# Patient Record
Sex: Male | Born: 1955 | Race: White | Hispanic: No | Smoking: Former smoker
Health system: Southern US, Community
[De-identification: ages and names within clinical notes are randomized; demographics above are authoritative.]

## PROBLEM LIST (undated history)

## (undated) DIAGNOSIS — C4490 Unspecified malignant neoplasm of skin, unspecified: Secondary | ICD-10-CM

## (undated) DIAGNOSIS — I10 Essential (primary) hypertension: Secondary | ICD-10-CM

---

## 2006-11-04 ENCOUNTER — Emergency Department (HOSPITAL_COMMUNITY): Admission: EM | Admit: 2006-11-04 | Discharge: 2006-11-05 | Payer: Self-pay | Admitting: Emergency Medicine

## 2010-12-18 LAB — BASIC METABOLIC PANEL
BUN: 34 — ABNORMAL HIGH
CO2: 20
Chloride: 105
Creatinine, Ser: 1.78 — ABNORMAL HIGH
Glucose, Bld: 92

## 2010-12-18 LAB — CBC
HCT: 40.3
MCHC: 35.2
MCV: 92.2
Platelets: 222

## 2010-12-18 LAB — POCT CARDIAC MARKERS: Operator id: 272551

## 2019-02-09 ENCOUNTER — Other Ambulatory Visit: Payer: Self-pay

## 2019-02-09 ENCOUNTER — Observation Stay (HOSPITAL_BASED_OUTPATIENT_CLINIC_OR_DEPARTMENT_OTHER)
Admission: EM | Admit: 2019-02-09 | Discharge: 2019-02-10 | Disposition: A | Discharge status: Home / Self Care | Payer: Managed Care, Other (non HMO) | Attending: Internal Medicine | Admitting: Internal Medicine

## 2019-02-09 ENCOUNTER — Emergency Department (HOSPITAL_BASED_OUTPATIENT_CLINIC_OR_DEPARTMENT_OTHER): Payer: Managed Care, Other (non HMO)

## 2019-02-09 ENCOUNTER — Encounter (HOSPITAL_BASED_OUTPATIENT_CLINIC_OR_DEPARTMENT_OTHER): Payer: Self-pay

## 2019-02-09 DIAGNOSIS — U071 COVID-19: Secondary | ICD-10-CM | POA: Diagnosis not present

## 2019-02-09 DIAGNOSIS — I10 Essential (primary) hypertension: Secondary | ICD-10-CM

## 2019-02-09 DIAGNOSIS — G459 Transient cerebral ischemic attack, unspecified: Principal | ICD-10-CM | POA: Diagnosis present

## 2019-02-09 DIAGNOSIS — E876 Hypokalemia: Secondary | ICD-10-CM | POA: Diagnosis not present

## 2019-02-09 DIAGNOSIS — E785 Hyperlipidemia, unspecified: Secondary | ICD-10-CM | POA: Diagnosis not present

## 2019-02-09 DIAGNOSIS — Z87891 Personal history of nicotine dependence: Secondary | ICD-10-CM | POA: Diagnosis not present

## 2019-02-09 DIAGNOSIS — Z7982 Long term (current) use of aspirin: Secondary | ICD-10-CM | POA: Diagnosis not present

## 2019-02-09 DIAGNOSIS — Z79899 Other long term (current) drug therapy: Secondary | ICD-10-CM | POA: Diagnosis not present

## 2019-02-09 DIAGNOSIS — R4789 Other speech disturbances: Secondary | ICD-10-CM | POA: Diagnosis present

## 2019-02-09 HISTORY — DX: Unspecified malignant neoplasm of skin, unspecified: C44.90

## 2019-02-09 HISTORY — DX: Essential (primary) hypertension: I10

## 2019-02-09 LAB — COMPREHENSIVE METABOLIC PANEL
ALT: 17 U/L (ref 0–44)
AST: 29 U/L (ref 15–41)
Albumin: 4.1 g/dL (ref 3.5–5.0)
Alkaline Phosphatase: 70 U/L (ref 38–126)
Anion gap: 11 (ref 5–15)
BUN: 18 mg/dL (ref 8–23)
CO2: 22 mmol/L (ref 22–32)
Calcium: 9.2 mg/dL (ref 8.9–10.3)
Chloride: 103 mmol/L (ref 98–111)
Creatinine, Ser: 1.06 mg/dL (ref 0.61–1.24)
GFR calc Af Amer: >60 mL/min (ref >60)
GFR calc non Af Amer: >60 mL/min (ref >60)
Glucose, Bld: 110 mg/dL — ABNORMAL HIGH (ref 70–99)
Potassium: 3.4 mmol/L — ABNORMAL LOW (ref 3.5–5.1)
Sodium: 136 mmol/L (ref 135–145)
Total Bilirubin: 0.9 mg/dL (ref 0.3–1.2)
Total Protein: 7.9 g/dL (ref 6.5–8.1)

## 2019-02-09 LAB — DIFFERENTIAL
Abs Immature Granulocytes: 0.04 10*3/uL (ref 0.00–0.07)
Basophils Absolute: 0 10*3/uL (ref 0.0–0.1)
Basophils Relative: 0 %
Eosinophils Absolute: 0 10*3/uL (ref 0.0–0.5)
Eosinophils Relative: 0 %
Immature Granulocytes: 1 %
Lymphocytes Relative: 21 %
Lymphs Abs: 1 10*3/uL (ref 0.7–4.0)
Monocytes Absolute: 0.4 10*3/uL (ref 0.1–1.0)
Monocytes Relative: 9 %
Neutro Abs: 3.2 10*3/uL (ref 1.7–7.7)
Neutrophils Relative %: 69 %

## 2019-02-09 LAB — CBC
HCT: 48.6 % (ref 39.0–52.0)
Hemoglobin: 16.9 g/dL (ref 13.0–17.0)
MCH: 31.6 pg (ref 26.0–34.0)
MCHC: 34.8 g/dL (ref 30.0–36.0)
MCV: 90.8 fL (ref 80.0–100.0)
Platelets: 161 10*3/uL (ref 150–400)
RBC: 5.35 MIL/uL (ref 4.22–5.81)
RDW: 12.1 % (ref 11.5–15.5)
WBC: 4.7 10*3/uL (ref 4.0–10.5)
nRBC: 0 % (ref 0.0–0.2)

## 2019-02-09 LAB — URINALYSIS, ROUTINE W REFLEX MICROSCOPIC
Bilirubin Urine: NEGATIVE
Glucose, UA: NEGATIVE mg/dL
Hgb urine dipstick: NEGATIVE
Ketones, ur: 15 mg/dL — AB
Leukocytes,Ua: NEGATIVE
Nitrite: NEGATIVE
Protein, ur: NEGATIVE mg/dL
Specific Gravity, Urine: 1.015 (ref 1.005–1.030)
pH: 6 (ref 5.0–8.0)

## 2019-02-09 LAB — ETHANOL: Alcohol, Ethyl (B): <10 mg/dL (ref <10)

## 2019-02-09 LAB — RAPID URINE DRUG SCREEN, HOSP PERFORMED
Amphetamines: NOT DETECTED
Barbiturates: NOT DETECTED
Benzodiazepines: NOT DETECTED
Cocaine: NOT DETECTED
Opiates: NOT DETECTED
Tetrahydrocannabinol: NOT DETECTED

## 2019-02-09 LAB — APTT: aPTT: 28 seconds (ref 24–36)

## 2019-02-09 LAB — PROTIME-INR
INR: 0.9 (ref 0.8–1.2)
Prothrombin Time: 12.4 seconds (ref 11.4–15.2)

## 2019-02-09 MED ORDER — ASPIRIN EC 325 MG PO TBEC
325.0000 mg | DELAYED_RELEASE_TABLET | Freq: Once | ORAL | Status: AC
  Administered 2019-02-09: 325 mg via ORAL
  Filled 2019-02-09: qty 1

## 2019-02-09 NOTE — ED Notes (Signed)
ED Provider at bedside. 

## 2019-02-09 NOTE — ED Notes (Signed)
  Patient ambulated to restroom.  Patient tolerated well and required no assistance.

## 2019-02-09 NOTE — Progress Notes (Signed)
Patient with h/o HTN (not on medications, but recently noted to have marked return and so resumed therapy a couple of days ago) presenting to [facility] with aphasia lasting about 20-25 minutes.  Concern for TIA, teleneurology has seen.  CT with ?old event.  Recommend observation for further evaluation.    Carlyon Shadow,, M.D.

## 2019-02-09 NOTE — Consult Note (Signed)
[facility] Consult Services  Stat Consult  Date of Service:   02/09/2019 13:47:26  Impression:     .  G45.9 - Transient cerebral ischaemic attack, unspecified  Comments/Sign-Out: Patient presenting with TIA associated with HTN. NIHSS:0. CTH without acute findings. Will recommend admission for stroke work up. Will need MRI brain, MRA or CTA head and neck. Echo, lipid panel, TSH, A1c, B12. PT consult. IVF's: 0.9 NSS at 120 ml/hr. Treat if SBP >200 and SBP >110.  CT HEAD: Showed No Acute Hemorrhage or Acute Core Infarct  Metrics: [facility] Notification Time: 02/09/2019 13:45:47 Stamp Time: 02/09/2019 13:47:26 Callback Response Time: 02/09/2019 13:48:12 Video Start Time: 02/09/2019 14:17:03 Video End Time: 02/09/2019 14:31:34  Our recommendations are outlined below.  Recommendations:     .  Antiplatelet Therapy   Imaging Studies:     .  MRI Head     .  MRA Head and Neck Without Contrast When Available - Stroke Protocol     .  Echocardiogram - Transthoracic Echocardiogram  Therapies:     .  Physical Therapy, Occupational Therapy, Speech Therapy Assessment When Applicable  Other WorkUp:     .  Check B12 level     .  Check TSH  Disposition: Neurology Follow Up Recommended  Sign Out:     .  Discussed with Emergency Department Provider  ----------------------------------------------------------------------------------------------------  Chief Complaint: TIA  History of Present Illness: Patient is a 63 year old Male.  Patient reported he was at home when suddenly started feeling that he could not get he words out. He said he knew what he wanted to say but it was not coming out. He said all episode lasted 50 minutes. He said he had similar episode years ago but at that time he was smoking and he was overweight. He is not smoking anymore and have been taking care of himself but most recently started having HTN and was started on  medications. He said at home is now getting better. Today on arrival it was elevated.           Examination: BP(146/100), Pulse(96), Blood Glucose(110) 1A: Level of Consciousness - Alert; keenly responsive + 0 1B: Ask Month and Age - Both Questions Right + 0 1C: Blink Eyes & Squeeze Hands - Performs Both Tasks + 0 2: Test Horizontal Extraocular Movements - Normal + 0 3: Test Visual Fields - No Visual Loss + 0 4: Test Facial Palsy (Use Grimace if Obtunded) - Normal symmetry + 0 5A: Test Left Arm Motor Drift - No Drift for 10 Seconds + 0 5B: Test Right Arm Motor Drift - No Drift for 10 Seconds + 0 6A: Test Left Leg Motor Drift - No Drift for 5 Seconds + 0 6B: Test Right Leg Motor Drift - No Drift for 5 Seconds + 0 7: Test Limb Ataxia (FNF/Heel-Shin) - No Ataxia + 0 8: Test Sensation - Normal; No sensory loss + 0 9: Test Language/Aphasia - Normal; No aphasia + 0 10: Test Dysarthria - Normal + 0 11: Test Extinction/Inattention - No abnormality + 0  NIHSS Score: 0     Dr Anabel Halon   [facility] [phone]  Case [id number]

## 2019-02-09 NOTE — ED Provider Notes (Signed)
[facility] EMERGENCY DEPARTMENT Provider Note   CSN: [id number] Arrival date & time: 02/09/19  1248     History   Chief Complaint Chief Complaint  Patient presents with   Speech Problem    HPI Dan Gibson is a 63 y.o. male.  He has a history of hypertension but has been off medications for 8 years.  He had a skin cancer removed last week and his blood pressure was noted to be very elevated.  2 days ago he saw his PCP and was put on a medication for elevated blood pressure.  Today while at lunch with his wife at 3 he began talking in [place] per his wife.  The symptoms lasted about 20 to 25 minutes.  It was not associated with any double vision numbness weakness or any difficulty with gait.  He said maybe his vision was a little blurry but he also thinks he needs glasses.  He feels that his symptoms are completely back to normal.  Low-grade headache that he blames on sinus problems.  He said he had one prior episode of this speech difficulty years ago and never got it worked up.     The history is provided by the patient.  Cerebrovascular Accident This is a new problem. The current episode started less than 1 hour ago. The problem has been resolved. Associated symptoms include headaches. Pertinent negatives include no chest pain, no abdominal pain and no shortness of breath. Nothing aggravates the symptoms. Nothing relieves the symptoms. He has tried nothing for the symptoms. The treatment provided significant relief.    Past Medical History:  Diagnosis Date   Hypertension    Skin cancer     There are no active problems to display for this patient.   Past Surgical History:  Procedure Laterality Date   BACK SURGERY     VASECTOMY          Home Medications    Prior to Admission medications   Not on File    Family History No family history on file.  Social History Social History   Tobacco Use   Smoking status: Former Smoker   Smokeless  tobacco: Never Used  Substance Use Topics   Alcohol use: Yes    Comment: daily-2 beers   Drug use: Never     Allergies   Patient has no known allergies.   Review of Systems Review of Systems  Constitutional: Negative for fever.  HENT: Negative for sore throat.   Eyes: Negative for visual disturbance.  Respiratory: Negative for shortness of breath.   Cardiovascular: Negative for chest pain.  Gastrointestinal: Negative for abdominal pain.  Genitourinary: Negative for dysuria.  Musculoskeletal: Negative for neck pain.  Skin: Negative for rash.  Neurological: Positive for speech difficulty and headaches. Negative for dizziness, seizures, syncope, facial asymmetry, weakness, light-headedness and numbness.     Physical Exam Updated Vital Signs BP (!) 144/91 (BP Location: Left Arm)    Pulse 96    Temp 99.3 F (37.4 C) (Oral)    Resp 18    Ht 5\' 7"  (1.702 m)    Wt 73.9 kg    SpO2 100%    BMI 25.53 kg/m   Physical Exam Vitals signs and nursing note reviewed.  Constitutional:      Appearance: He is well-developed.  HENT:     Head: Normocephalic and atraumatic.  Eyes:     Conjunctiva/sclera: Conjunctivae normal.  Neck:     Musculoskeletal: Neck supple.  Cardiovascular:  Rate and Rhythm: Normal rate and regular rhythm.     Heart sounds: No murmur.  Pulmonary:     Effort: Pulmonary effort is normal. No respiratory distress.     Breath sounds: Normal breath sounds.  Abdominal:     Palpations: Abdomen is soft.     Tenderness: There is no abdominal tenderness.  Musculoskeletal: Normal range of motion.     Right lower leg: No edema.     Left lower leg: No edema.  Skin:    General: Skin is warm and dry.     Capillary Refill: Capillary refill takes less than 2 seconds.  Neurological:     General: No focal deficit present.     Mental Status: He is alert.     GCS: GCS eye subscore is 4. GCS verbal subscore is 5. GCS motor subscore is 6.     Cranial Nerves: Cranial nerves  are intact.     Sensory: Sensation is intact.     Motor: Motor function is intact.     Coordination: Coordination is intact.     Gait: Gait is intact.      ED Treatments / Results  Labs (all labs ordered are listed, but only abnormal results are displayed) Labs Reviewed  COMPREHENSIVE METABOLIC PANEL - Abnormal; Notable for the following components:      Result Value   Potassium 3.4 (*)    Glucose, Bld 110 (*)    All other components within normal limits  URINALYSIS, ROUTINE W REFLEX MICROSCOPIC - Abnormal; Notable for the following components:   Ketones, ur 15 (*)    All other components within normal limits  SARS CORONAVIRUS 2 (TAT 6-24 HRS)  ETHANOL  PROTIME-INR  APTT  CBC  DIFFERENTIAL  RAPID URINE DRUG SCREEN, HOSP PERFORMED    EKG EKG Interpretation  Date/Time:  Friday February 09 2019 13:33:39 EST Ventricular Rate:  90 PR Interval:    QRS Duration: 93 QT Interval:  371 QTC Calculation: 454 R Axis:   73 Text Interpretation: Sinus rhythm Consider right atrial enlargement Borderline ST elevation, anterior leads similar to prior 8/08 Confirmed by Aletta Edouard [phone]) on 02/09/2019 1:40:02 PM   Radiology Ct Head Wo Contrast  Result Date: 02/09/2019 CLINICAL DATA:  Speech difficulty EXAM: CT HEAD WITHOUT CONTRAST TECHNIQUE: Contiguous axial images were obtained from the base of the skull through the vertex without intravenous contrast. COMPARISON:  None. FINDINGS: Brain: There is no acute intracranial hemorrhage, mass-effect, or edema. Gray-white differentiation is preserved. There is no extra-axial fluid collection. There are age-indeterminate small vessel infarcts of the right basal ganglia and adjacent white matter as well as the right subinsular white matter. Ventricles and sulci are within normal limits in size and configuration. Vascular: No hyperdense vessel or unexpected calcification. Skull: Calvarium is unremarkable. Sinuses/Orbits: Minor paranasal sinus  mucosal thickening. Orbits are unremarkable. Other: Mastoid air cells are clear. IMPRESSION: No acute intracranial hemorrhage or mass effect. Age-indeterminate small vessel infarcts of the right basal ganglia and right subinsular white matter. Electronically Signed   By: Macy Mis M.D.   On: 02/09/2019 14:26    Procedures Procedures (including critical care time)  Medications Ordered in ED Medications  aspirin EC tablet 325 mg (325 mg Oral Given 02/09/19 1503)     Initial Impression / Assessment and Plan / ED Course  I have reviewed the triage vital signs and the nursing notes.  Pertinent labs & imaging results that were available during my care of the patient were reviewed  by me and considered in my medical decision making (see chart for details).  Clinical Course as of Feb 08 1805  Fri Feb 08, 7142  [id number] 63 year old male with recently diagnosed of elevated blood pressure and started new medication 2 days ago here with 20 minutes of aphasia and garbled speech.  Symptoms completely resolved.  Mildly hypertensive here.  Differential includes hypertensive urgency/emergency, TIA, stroke, bleed, hypoglycemia.   [MB]  [id number] Discussed with teleneurologist Dr. Enid Derry.  He is thinking this is a TIA and the patient should be admitted for further work-up.  Head CT is showing some old signs of stroke so patient possibly had a prior event.   [MB]  [id number] I reviewed the results with the patient and he is agreeable to admission.  [facility] hospitalist has been paged.   [MB]  [id number] Discussed with Dr. Lorin Mercy from [facility] hospitalist accepts patient for admission.   [MB]    Clinical Course User Index [MB] Hayden Rasmussen, MD        Final Clinical Impressions(s) / ED Diagnoses   Final diagnoses:  TIA (transient ischemic attack)  Essential hypertension    ED Discharge Orders    None       Hayden Rasmussen, MD 02/09/19 [phone]

## 2019-02-09 NOTE — ED Triage Notes (Addendum)
Per wife pt had episode just PTA while at lunch where pt "was talking gibberish and like he couldn't get his words out"-lasted ~20-25 mins-pt agrees and states he feels baseline-wife agrees-wife states pt started new BP 2 days ago-pt noncompliant and decreased dose at home-pt NAD-active/alert-NAD-steady gait

## 2019-02-10 ENCOUNTER — Observation Stay (HOSPITAL_COMMUNITY): Payer: Managed Care, Other (non HMO)

## 2019-02-10 ENCOUNTER — Encounter (HOSPITAL_COMMUNITY): Payer: Self-pay | Admitting: Internal Medicine

## 2019-02-10 ENCOUNTER — Observation Stay (HOSPITAL_BASED_OUTPATIENT_CLINIC_OR_DEPARTMENT_OTHER): Payer: Managed Care, Other (non HMO)

## 2019-02-10 DIAGNOSIS — I340 Nonrheumatic mitral (valve) insufficiency: Secondary | ICD-10-CM

## 2019-02-10 DIAGNOSIS — G459 Transient cerebral ischemic attack, unspecified: Secondary | ICD-10-CM | POA: Diagnosis not present

## 2019-02-10 DIAGNOSIS — E876 Hypokalemia: Secondary | ICD-10-CM

## 2019-02-10 DIAGNOSIS — I10 Essential (primary) hypertension: Secondary | ICD-10-CM

## 2019-02-10 DIAGNOSIS — U071 COVID-19: Secondary | ICD-10-CM

## 2019-02-10 LAB — HEMOGLOBIN A1C
Hgb A1c MFr Bld: 5.6 % (ref 4.8–5.6)
Mean Plasma Glucose: 114.02 mg/dL

## 2019-02-10 LAB — LACTATE DEHYDROGENASE: LDH: 175 U/L (ref 98–192)

## 2019-02-10 LAB — LIPID PANEL
Cholesterol: 149 mg/dL (ref 0–200)
HDL: 45 mg/dL (ref >40)
LDL Cholesterol: 85 mg/dL (ref 0–99)
Total CHOL/HDL Ratio: 3.3 RATIO
Triglycerides: 94 mg/dL (ref <150)
VLDL: 19 mg/dL (ref 0–40)

## 2019-02-10 LAB — MAGNESIUM: Magnesium: 2 mg/dL (ref 1.7–2.4)

## 2019-02-10 LAB — HIV ANTIBODY (ROUTINE TESTING W REFLEX): HIV Screen 4th Generation wRfx: NONREACTIVE

## 2019-02-10 LAB — FERRITIN: Ferritin: 513 ng/mL — ABNORMAL HIGH (ref 24–336)

## 2019-02-10 LAB — FIBRINOGEN: Fibrinogen: 538 mg/dL — ABNORMAL HIGH (ref 210–475)

## 2019-02-10 LAB — SARS CORONAVIRUS 2 (TAT 6-24 HRS): SARS Coronavirus 2: POSITIVE — AB

## 2019-02-10 LAB — VITAMIN B12: Vitamin B-12: 292 pg/mL (ref 180–914)

## 2019-02-10 LAB — C-REACTIVE PROTEIN: CRP: 3.4 mg/dL — ABNORMAL HIGH (ref <1.0)

## 2019-02-10 LAB — ECHOCARDIOGRAM COMPLETE
Height: 67 in
Weight: 2608 oz

## 2019-02-10 LAB — D-DIMER, QUANTITATIVE: D-Dimer, Quant: 0.51 ug/mL-FEU — ABNORMAL HIGH (ref 0.00–0.50)

## 2019-02-10 LAB — TSH: TSH: 2.016 u[IU]/mL (ref 0.350–4.500)

## 2019-02-10 MED ORDER — ASPIRIN 325 MG PO TABS: 325.0000 mg | ORAL_TABLET | Freq: Every day | ORAL | 0 refills | Status: AC

## 2019-02-10 MED ORDER — HYDROCHLOROTHIAZIDE 12.5 MG PO CAPS
12.5000 mg | ORAL_CAPSULE | Freq: Every day | ORAL | Status: DC
  Administered 2019-02-10: 12.5 mg via ORAL
  Filled 2019-02-10: qty 1

## 2019-02-10 MED ORDER — VITAMIN C 500 MG PO TABS
500.0000 mg | ORAL_TABLET | Freq: Every day | ORAL | Status: DC
  Administered 2019-02-10: 500 mg via ORAL
  Filled 2019-02-10: qty 1

## 2019-02-10 MED ORDER — ASPIRIN 325 MG PO TABS
325.0000 mg | ORAL_TABLET | Freq: Every day | ORAL | Status: DC
  Administered 2019-02-10: 325 mg via ORAL
  Filled 2019-02-10: qty 1

## 2019-02-10 MED ORDER — LOSARTAN POTASSIUM 50 MG PO TABS
50.0000 mg | ORAL_TABLET | Freq: Every day | ORAL | Status: DC
  Administered 2019-02-10: 50 mg via ORAL
  Filled 2019-02-10: qty 1

## 2019-02-10 MED ORDER — ATORVASTATIN CALCIUM 80 MG PO TABS: 80.0000 mg | ORAL_TABLET | Freq: Every day | ORAL | 2 refills | Status: AC

## 2019-02-10 MED ORDER — ZINC SULFATE 220 (50 ZN) MG PO CAPS
220.0000 mg | ORAL_CAPSULE | Freq: Every day | ORAL | Status: DC
  Administered 2019-02-10: 220 mg via ORAL
  Filled 2019-02-10 (×2): qty 1

## 2019-02-10 MED ORDER — STROKE: EARLY STAGES OF RECOVERY BOOK
Freq: Once | Status: AC
  Administered 2019-02-10: 05:00:00
  Filled 2019-02-10: qty 1

## 2019-02-10 MED ORDER — ACETAMINOPHEN 650 MG RE SUPP: 650.0000 mg | RECTAL | Status: DC | PRN

## 2019-02-10 MED ORDER — POTASSIUM CHLORIDE CRYS ER 20 MEQ PO TBCR
40.0000 meq | EXTENDED_RELEASE_TABLET | Freq: Once | ORAL | Status: AC
  Administered 2019-02-10: 40 meq via ORAL
  Filled 2019-02-10: qty 2

## 2019-02-10 MED ORDER — LOSARTAN POTASSIUM-HCTZ 50-12.5 MG PO TABS: 1.0000 | ORAL_TABLET | Freq: Every day | ORAL | Status: DC

## 2019-02-10 MED ORDER — CHLORHEXIDINE GLUCONATE CLOTH 2 % EX PADS: 6.0000 | MEDICATED_PAD | Freq: Every day | CUTANEOUS | Status: DC

## 2019-02-10 MED ORDER — ATORVASTATIN CALCIUM 40 MG PO TABS: 80.0000 mg | ORAL_TABLET | Freq: Every day | ORAL | Status: DC

## 2019-02-10 MED ORDER — ACETAMINOPHEN 160 MG/5ML PO SOLN: 650.0000 mg | ORAL | Status: DC | PRN

## 2019-02-10 MED ORDER — ACETAMINOPHEN 325 MG PO TABS
650.0000 mg | ORAL_TABLET | ORAL | Status: DC | PRN
  Administered 2019-02-10: 650 mg via ORAL
  Filled 2019-02-10: qty 2

## 2019-02-10 MED ORDER — ENOXAPARIN SODIUM 40 MG/0.4ML ~~LOC~~ SOLN
40.0000 mg | SUBCUTANEOUS | Status: DC
  Administered 2019-02-10: 40 mg via SUBCUTANEOUS
  Filled 2019-02-10: qty 0.4

## 2019-02-10 NOTE — Discharge Summary (Signed)
Physician Discharge Summary  Dan Gibson [id number] DOB: May 29, 1955 DOA: 02/09/2019  PCP: Patient, No Pcp Per  Admit date: 02/09/2019 Discharge date: 02/10/2019  Admitted From: Home. Disposition: Home.  Recommendations for Outpatient Follow-up:  1. Follow up with PCP in 1-2 weeks Follow-up with neurology, referral sent Home Health: Not applicable Equipment/Devices: Not applicable  Discharge Condition: Stable CODE STATUS: Full code Diet recommendation: Low-salt diet  Discharge summary: Patient with history of hypertension, recently started on treatment with lisinopril hydrochlorothiazide had sudden onset of garbled speech for about 20 minutes and back to normal came to emergency room with these abnormal sudden onset of garbled speech.  In the emergency room, blood pressure is stable.  No neurological findings.  Head CT showing some chronic ischemic changes.  Seen by neurology.  Admitted to the hospital as TIA work-up.  Also found to have COVID-19 infection.  TIA: Clinical findings, sudden onset garbled speech no other neuro deficits.  No neuro deficits on arrival to ER. CT head findings, age-indeterminate small vessel infarcts of the right basal ganglia and right subinsular white matter. 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  Chronic small vessel ischemic changes.  No acute infarctions. MRA of the head and neck: Normal.  No large vessel occlusions. 2D echocardiogram, normal ejection fraction.  No PFO.  No evidence of blood clot. Treatment plan: No neuro deficit.  Neurology recommended aspirin 325 mg daily, LDL was 85, Lipitor 80 mg daily, A1c was 5.6, no indication for treatment.  Resume blood pressure medications and compliance was educated.    COVID-19 infection: No pulmonary symptoms.  No cough or fever.  Chest x-ray showed possible bilateral infiltrates.  Patient is on room air and walking around with no pulmonary  symptoms.  Currently no indication for treat. COVID-19 home monitoring, isolation and quarantine precautions instructions given. Can use Tylenol for fever.  Over-the-counter cough medications. Patient will visit ER or call his primary care physician if he has any symptoms of shortness of breath, increasing cough or very high fever or fatigue.    Discharge Diagnoses:  Principal Problem:   TIA (transient ischemic attack) Active Problems:   HTN (hypertension)   Lab test positive for detection of COVID-19 virus   Hypokalemia    Discharge Instructions  Discharge Instructions    Ambulatory referral to Neurology   Complete by: As directed    An appointment is requested in approximately: 4 weeks   Call MD for:  difficulty breathing, headache or visual disturbances   Complete by: As directed    Call MD for:  temperature >100.4   Complete by: As directed    Diet - low sodium heart healthy   Complete by: As directed    Increase activity slowly   Complete by: As directed    MyChart COVID-19 home monitoring program   Complete by: Feb 10, 2019    Is the patient willing to use the [facility] for home monitoring?: Yes   Temperature monitoring   Complete by: Feb 10, 2019    After how many days would you like to receive a notification of this patient's flowsheet entries?: 1     Allergies as of 02/10/2019   No Known Allergies     Medication List    TAKE these medications   aspirin 325 MG tablet Take 1 tablet (325 mg total) by mouth daily. Start taking on: February 11, 2019   atorvastatin 80 MG tablet Commonly known as:  LIPITOR Take 1 tablet (80 mg total) by mouth daily at 6 PM.   colchicine 0.6 MG tablet Take 0.6 mg by mouth 2 (two) times daily as needed. Gout   fluorouracil 5 % cream Commonly known as: EFUDEX Apply 1 application topically 2 (two) times daily. For 30 days. Apply and leave on for 6 hours then wash off.   losartan-hydrochlorothiazide 50-12.5 MG  tablet Commonly known as: HYZAAR Take 1 tablet by mouth daily.       No Known Allergies  Consultations:  Neurology   Procedures/Studies: Ct Head Wo Contrast  Result Date: 02/09/2019 CLINICAL DATA:  Speech difficulty EXAM: CT HEAD WITHOUT CONTRAST TECHNIQUE: Contiguous axial images were obtained from the base of the skull through the vertex without intravenous contrast. COMPARISON:  None. FINDINGS: Brain: There is no acute intracranial hemorrhage, mass-effect, or edema. Gray-white differentiation is preserved. There is no extra-axial fluid collection. There are age-indeterminate small vessel infarcts of the right basal ganglia and adjacent white matter as well as the right subinsular white matter. Ventricles and sulci are within normal limits in size and configuration. Vascular: No hyperdense vessel or unexpected calcification. Skull: Calvarium is unremarkable. Sinuses/Orbits: Minor paranasal sinus mucosal thickening. Orbits are unremarkable. Other: Mastoid air cells are clear. IMPRESSION: No acute intracranial hemorrhage or mass effect. Age-indeterminate small vessel infarcts of the right basal ganglia and right subinsular white matter. Electronically Signed   By: Macy Mis M.D.   On: 02/09/2019 14:26   Mr Angio Head Wo Contrast  Result Date: 02/10/2019 CLINICAL DATA:  Hypertension and speech disturbance. EXAM: MRI HEAD WITHOUT CONTRAST MRA HEAD WITHOUT CONTRAST MRA NECK WITHOUT CONTRAST TECHNIQUE: Multiplanar, multiecho pulse sequences of the brain and surrounding structures were obtained without intravenous contrast. Angiographic images of the Circle of Willis were obtained using MRA technique without intravenous contrast. Angiographic images of the neck were obtained using MRA technique without intravenous contrast. Carotid stenosis measurements (when applicable) are obtained utilizing NASCET criteria, using the distal internal carotid diameter as the denominator. COMPARISON:  Head CT  yesterday. FINDINGS: MRI HEAD FINDINGS Brain: Diffusion imaging does not show any acute or subacute infarction. The brainstem and cerebellum do not show any focal lesion. 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 Cerebral hemispheres show old small vessel infarctions in the basal ganglia on the right and moderate chronic small-vessel ischemic changes of the deep and subcortical white matter. No large vessel territory infarction. Superficial siderosis also affecting the surface of the supratentorial brain, though much less pronounced. No hydrocephalus. No extra-axial collection. Vascular: Major vessels at the base of the brain show flow. Skull and upper cervical spine: Negative Sinuses/Orbits: Clear/normal Other: None MRA HEAD FINDINGS Both internal carotid arteries are widely patent into the brain. No siphon stenosis. The anterior and middle cerebral vessels are patent without proximal stenosis, aneurysm or vascular malformation. Both vertebral arteries are widely patent to the basilar. No basilar stenosis. Posterior circulation branch vessels appear normal. MRA NECK FINDINGS Aortic arch appears normal. Branching pattern is normal without origin stenosis. Both common carotid arteries are widely patent to the bifurcation region. Both carotid bifurcations appear normal. Cervical internal carotid arteries appear normal. Both vertebral arteries appear widely patent at their origin and through the cervical region to the basilar. 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 Is there a history of previous CNS hemorrhagic event? Consider MRI of the thoracic spine with and without contrast as thoracic dural AV fistula can be a cause of this, particularly in  males of this age. 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 Chronic small-vessel ischemic changes elsewhere of the cerebral hemispheric white matter. Negative noncontrast MR angiography  of the neck vessels and intracranial vessels. Electronically Signed   By: Nelson Chimes M.D.   On: 02/10/2019 13:54   Mr Angio Neck Wo Contrast  Result Date: 02/10/2019 CLINICAL DATA:  Hypertension and speech disturbance. EXAM: MRI HEAD WITHOUT CONTRAST MRA HEAD WITHOUT CONTRAST MRA NECK WITHOUT CONTRAST TECHNIQUE: Multiplanar, multiecho pulse sequences of the brain and surrounding structures were obtained without intravenous contrast. Angiographic images of the Circle of Willis were obtained using MRA technique without intravenous contrast. Angiographic images of the neck were obtained using MRA technique without intravenous contrast. Carotid stenosis measurements (when applicable) are obtained utilizing NASCET criteria, using the distal internal carotid diameter as the denominator. COMPARISON:  Head CT yesterday. FINDINGS: MRI HEAD FINDINGS Brain: Diffusion imaging does not show any acute or subacute infarction. The brainstem and cerebellum do not show any focal lesion. 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 Cerebral hemispheres show old small vessel infarctions in the basal ganglia on the right and moderate chronic small-vessel ischemic changes of the deep and subcortical white matter. No large vessel territory infarction. Superficial siderosis also affecting the surface of the supratentorial brain, though much less pronounced. No hydrocephalus. No extra-axial collection. Vascular: Major vessels at the base of the brain show flow. Skull and upper cervical spine: Negative Sinuses/Orbits: Clear/normal Other: None MRA HEAD FINDINGS Both internal carotid arteries are widely patent into the brain. No siphon stenosis. The anterior and middle cerebral vessels are patent without proximal stenosis, aneurysm or vascular malformation. Both vertebral arteries are widely patent to the basilar. No basilar stenosis. Posterior circulation branch vessels appear normal. MRA NECK FINDINGS Aortic arch appears normal.  Branching pattern is normal without origin stenosis. Both common carotid arteries are widely patent to the bifurcation region. Both carotid bifurcations appear normal. Cervical internal carotid arteries appear normal. Both vertebral arteries appear widely patent at their origin and through the cervical region to the basilar. 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 Is there a history of previous CNS hemorrhagic event? Consider MRI of the thoracic spine with and without contrast as thoracic dural AV fistula can be a cause of this, particularly in males of this age. 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 Chronic small-vessel ischemic changes elsewhere of the cerebral hemispheric white matter. Negative noncontrast MR angiography of the neck vessels and intracranial vessels. Electronically Signed   By: Nelson Chimes M.D.   On: 02/10/2019 13:54   Mr Brain Wo Contrast  Result Date: 02/10/2019 CLINICAL DATA:  Hypertension and speech disturbance. EXAM: MRI HEAD WITHOUT CONTRAST MRA HEAD WITHOUT CONTRAST MRA NECK WITHOUT CONTRAST TECHNIQUE: Multiplanar, multiecho pulse sequences of the brain and surrounding structures were obtained without intravenous contrast. Angiographic images of the Circle of Willis were obtained using MRA technique without intravenous contrast. Angiographic images of the neck were obtained using MRA technique without intravenous contrast. Carotid stenosis measurements (when applicable) are obtained utilizing NASCET criteria, using the distal internal carotid diameter as the denominator. COMPARISON:  Head CT yesterday. FINDINGS: MRI HEAD FINDINGS Brain: Diffusion imaging does not show any acute or subacute infarction. The brainstem and cerebellum do not show any focal lesion. 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 Cerebral hemispheres show old small vessel infarctions in the basal ganglia on the right and  moderate chronic small-vessel ischemic changes of the  deep and subcortical white matter. No large vessel territory infarction. Superficial siderosis also affecting the surface of the supratentorial brain, though much less pronounced. No hydrocephalus. No extra-axial collection. Vascular: Major vessels at the base of the brain show flow. Skull and upper cervical spine: Negative Sinuses/Orbits: Clear/normal Other: None MRA HEAD FINDINGS Both internal carotid arteries are widely patent into the brain. No siphon stenosis. The anterior and middle cerebral vessels are patent without proximal stenosis, aneurysm or vascular malformation. Both vertebral arteries are widely patent to the basilar. No basilar stenosis. Posterior circulation branch vessels appear normal. MRA NECK FINDINGS Aortic arch appears normal. Branching pattern is normal without origin stenosis. Both common carotid arteries are widely patent to the bifurcation region. Both carotid bifurcations appear normal. Cervical internal carotid arteries appear normal. Both vertebral arteries appear widely patent at their origin and through the cervical region to the basilar. 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 Is there a history of previous CNS hemorrhagic event? Consider MRI of the thoracic spine with and without contrast as thoracic dural AV fistula can be a cause of this, particularly in males of this age. 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 Chronic small-vessel ischemic changes elsewhere of the cerebral hemispheric white matter. Negative noncontrast MR angiography of the neck vessels and intracranial vessels. Electronically Signed   By: Nelson Chimes M.D.   On: 02/10/2019 13:54   Dg Chest Port 1 View  Result Date: 02/10/2019 CLINICAL DATA:  COVID positive, fever EXAM: PORTABLE CHEST 1 VIEW COMPARISON:  None. FINDINGS: Mild faint patchy opacities in the central left  upper lobe and right lung base, suspicious for multifocal pneumonia. No pleural effusion or pneumothorax. The heart is normal in size. IMPRESSION: Mild multifocal pneumonia in this patient with known COVID. Electronically Signed   By: Julian Hy M.D.   On: 02/10/2019 11:29     Subjective: Patient seen and examined.  No overnight events.  Early morning admission history and findings reviewed with patient. Imaging studies reviewed with him. He is eager to go home, dressed up and walking around in the room with the mask on.   Discharge Exam: Vitals:   02/10/19 1400 02/10/19 1500  BP: 124/85 (!) 137/97  Pulse:    Resp: 18 18  Temp:    SpO2: 98% 98%   Vitals:   02/10/19 1224 02/10/19 1300 02/10/19 1400 02/10/19 1500  BP:  (!) 141/90 124/85 (!) 137/97  Pulse: 68     Resp:  14 18 18   Temp:      TempSrc:      SpO2: 98% 98% 98% 98%  Weight:      Height:        General: Pt is alert, awake, not in acute distress. On room air Cardiovascular: RRR, S1/S2 +, no rubs, no gallops Respiratory: CTA bilaterally, no wheezing, no rhonchi Abdominal: Soft, NT, ND, bowel sounds + Extremities: no edema, no cyanosis Neurological: Cranial nerves II through XII normal.  No motor or sensory deficits.  Walking with no deficits.   The results of significant diagnostics from this hospitalization (including imaging, microbiology, ancillary and laboratory) are listed below for reference.     Microbiology: Recent Results (from the past 240 hour(s))  SARS CORONAVIRUS 2 (TAT 6-24 HRS) Nasopharyngeal Nasopharyngeal Swab     Status: Abnormal   Collection Time: 02/09/19  2:46 PM   Specimen: Nasopharyngeal Swab  Result Value Ref Range Status   SARS Coronavirus 2 POSITIVE (  A) NEGATIVE Final    Comment: RESULT CALLED TO, READ BACK BY AND VERIFIED WITH: S. BLEDSOE,RN 0320 02/10/2019 T. TYSOR (NOTE) SARS-CoV-2 target nucleic acids are DETECTED. The SARS-CoV-2 RNA is generally detectable in upper and  lower respiratory specimens during the acute phase of infection. Positive results are indicative of the presence of SARS-CoV-2 RNA. Clinical correlation with patient history and other diagnostic information is  necessary to determine patient infection status. Positive results do not rule out bacterial infection or co-infection with other viruses.  The expected result is Negative. Fact Sheet for Patients: SugarRoll.be Fact Sheet for Healthcare Providers: https://www.woods-mathews.com/ This test is not yet approved or cleared by the [place] FDA and  has been authorized for detection and/or diagnosis of SARS-CoV-2 by FDA under an Emergency Use Authorization (EUA). This EUA will remain  in effect (meaning this test can be used) for  the duration of the COVID-19 declaration under Section 564(b)(1) of the Act, 21 U.S.C. section 360bbb-3(b)(1), unless the authorization is terminated or revoked sooner. Performed at [facility], [place] [address], [place], [place] [address]      Labs: BNP (last 3 results) No results for input(s): BNP in the last 8760 hours. Basic Metabolic Panel: Recent Labs  Lab 02/09/19 1342 02/10/19 0541  NA 136  --   K 3.4*  --   CL 103  --   CO2 22  --   GLUCOSE 110*  --   BUN 18  --   CREATININE 1.06  --   CALCIUM 9.2  --   MG  --  2.0   Liver Function Tests: Recent Labs  Lab 02/09/19 1342  AST 29  ALT 17  ALKPHOS 70  BILITOT 0.9  PROT 7.9  ALBUMIN 4.1   No results for input(s): LIPASE, AMYLASE in the last 168 hours. No results for input(s): AMMONIA in the last 168 hours. CBC: Recent Labs  Lab 02/09/19 1342  WBC 4.7  NEUTROABS 3.2  HGB 16.9  HCT 48.6  MCV 90.8  PLT 161   Cardiac Enzymes: No results for input(s): CKTOTAL, CKMB, CKMBINDEX, TROPONINI in the last 168 hours. BNP: Invalid input(s): POCBNP CBG: No results for input(s): GLUCAP in the last 168 hours. D-Dimer Recent Labs     02/10/19 0712  DDIMER 0.51*   Hgb A1c Recent Labs    02/10/19 0541  HGBA1C 5.6   Lipid Profile Recent Labs    02/10/19 0541  CHOL 149  HDL 45  LDLCALC 85  TRIG 94  CHOLHDL 3.3   Thyroid function studies Recent Labs    02/10/19 0541  TSH 2.016   Anemia work up Recent Labs    02/10/19 0541  VITAMINB12 292  FERRITIN 513*   Urinalysis    Component Value Date/Time   COLORURINE YELLOW 02/09/2019 [place] 02/09/2019 1428   LABSPEC 1.015 02/09/2019 1428   PHURINE 6.0 02/09/2019 1428   GLUCOSEU NEGATIVE 02/09/2019 1428   [place] 02/09/2019 1428   [place] 02/09/2019 1428   KETONESUR 15 (A) 02/09/2019 1428   PROTEINUR NEGATIVE 02/09/2019 1428   NITRITE NEGATIVE 02/09/2019 1428   LEUKOCYTESUR NEGATIVE 02/09/2019 1428   Sepsis Labs Invalid input(s): PROCALCITONIN,  WBC,  LACTICIDVEN Microbiology Recent Results (from the past 240 hour(s))  SARS CORONAVIRUS 2 (TAT 6-24 HRS) Nasopharyngeal Nasopharyngeal Swab     Status: Abnormal   Collection Time: 02/09/19  2:46 PM   Specimen: Nasopharyngeal Swab  Result Value Ref Range Status  SARS Coronavirus 2 POSITIVE (A) NEGATIVE Final    Comment: RESULT CALLED TO, READ BACK BY AND VERIFIED WITH: S. BLEDSOE,RN 0320 02/10/2019 T. TYSOR (NOTE) SARS-CoV-2 target nucleic acids are DETECTED. The SARS-CoV-2 RNA is generally detectable in upper and lower respiratory specimens during the acute phase of infection. Positive results are indicative of the presence of SARS-CoV-2 RNA. Clinical correlation with patient history and other diagnostic information is  necessary to determine patient infection status. Positive results do not rule out bacterial infection or co-infection with other viruses.  The expected result is Negative. Fact Sheet for Patients: SugarRoll.be Fact Sheet for Healthcare Providers: https://www.woods-mathews.com/ This test is not yet  approved or cleared by the [place] FDA and  has been authorized for detection and/or diagnosis of SARS-CoV-2 by FDA under an Emergency Use Authorization (EUA). This EUA will remain  in effect (meaning this test can be used) for  the duration of the COVID-19 declaration under Section 564(b)(1) of the Act, 21 U.S.C. section 360bbb-3(b)(1), unless the authorization is terminated or revoked sooner. Performed at [facility], [place] [address] [place], [place] [address]      Time coordinating discharge:  32 minutes  SIGNED:   Barb Merino, MD  [facility] 02/10/2019, 4:09 PM

## 2019-02-10 NOTE — H&P (Signed)
History and Physical    Dan Gibson [id number] DOB: 11-16-1955 DOA: 02/09/2019  PCP: Patient, No Pcp Per Patient coming from: Home  Chief Complaint: Speech problem  HPI: Dan Gibson is a 63 y.o. male with medical history significant of hypertension, skin cancer presenting to the ED for evaluation of high blood pressure and a speech problem.  Patient states he previously used to have high blood pressure for which he was on medications.  His blood pressure medications were stopped 8 years ago as his blood pressure was too low.  He was recently diagnosed with basal cell carcinoma next to his left ear for which he is being seen by dermatology at [facility].  During his appointment last week he was told that his blood pressure was very high in the 200s.  He was advised to follow-up with primary care.  He saw his PCP 3 days ago and was started on losartan-hydrochlorothiazide.  Yesterday 12/4 around 12:30 PM all of a sudden his speech became garbled.  The entire episode lasted about 20 minutes before his speech went back to normal.  Family did not notice any drooping of his face.  He was not having any headaches or focal weakness/numbness.  Patient denies any recent sick contacts/Covid exposures.  Denies fevers, chills, fatigue, body aches, cough, shortness of breath, chest pain, nausea, vomiting, abdominal pain, or diarrhea.  States one night he felt maybe he had a little bit of a cold but no other symptoms otherwise.  He is feeling well at present.  He stopped smoking cigarettes 8 years ago.  ED Course: Systolic ranging from [id number] to 140s.  Blood ethanol level undetectable.  UDS negative.  SARS-CoV-2 test positive.  Head CT negative for acute finding. Patient was assessed by telemetry neurology and admission recommended for TIA work-up. Received aspirin 325 mg.  Review of Systems:  All systems reviewed and apart from history of presenting illness, are negative.  Past Medical History:   Diagnosis Date  . Hypertension   . Skin cancer     Past Surgical History:  Procedure Laterality Date  . BACK SURGERY    . VASECTOMY       reports that he has quit smoking. He has never used smokeless tobacco. He reports current alcohol use. He reports that he does not use drugs.  No Known Allergies  History reviewed. No pertinent family history.  Prior to Admission medications   Medication Sig Start Date End Date Taking? Authorizing Provider  Fluorouracil 5 % SOLN Apply topically.   Yes [provider]  hydrochlorothiazide (HYDRODIURIL) 25 MG tablet Take 25 mg by mouth daily.   Yes [provider]  losartan-hydrochlorothiazide (HYZAAR) 50-12.5 MG tablet Take 1 tablet by mouth daily.   Yes [provider]    Physical Exam: Vitals:   02/10/19 0000 02/10/19 0130 02/10/19 0230 02/10/19 0400  BP: 136/89 (!) 144/95 122/71 121/72  Pulse: 70 78 87 77  Resp:  18 20 18   Temp:  99.1 F (37.3 C) 99 F (37.2 C) 99.1 F (37.3 C)  TempSrc:  Oral Oral Oral  SpO2: 98% 100% 100% 100%  Weight:      Height:        Physical Exam  Constitutional: He is oriented to person, place, and time. He appears well-developed and well-nourished. No distress.  HENT:  Head: Normocephalic.  Eyes: EOM are normal. Right eye exhibits no discharge. Left eye exhibits no discharge.  Neck: Neck supple.  Cardiovascular: Normal  rate, regular rhythm and intact distal pulses.  Pulmonary/Chest: Effort normal and breath sounds normal. No respiratory distress. He has no wheezes. He has no rales.  Abdominal: Soft. Bowel sounds are normal. He exhibits no distension. There is no abdominal tenderness. There is no guarding.  Musculoskeletal:        General: No edema.  Neurological: He is alert and oriented to person, place, and time. No cranial nerve deficit.  Speech fluent, tongue midline, no facial droop Strength 5 out of 5 in bilateral upper and lower extremities. Sensation to light  touch intact throughout.  Skin: Skin is warm and dry. He is not diaphoretic.     Labs on Admission: I have personally reviewed following labs and imaging studies  CBC: Recent Labs  Lab 02/09/19 1342  WBC 4.7  NEUTROABS 3.2  HGB 16.9  HCT 48.6  MCV 90.8  PLT [id number]   Basic Metabolic Panel: Recent Labs  Lab 02/09/19 1342  NA 136  K 3.4*  CL 103  CO2 22  GLUCOSE 110*  BUN 18  CREATININE 1.06  CALCIUM 9.2   GFR: Estimated Creatinine Clearance: 68.4 mL/min (by C-G formula based on SCr of 1.06 mg/dL). Liver Function Tests: Recent Labs  Lab 02/09/19 1342  AST 29  ALT 17  ALKPHOS 70  BILITOT 0.9  PROT 7.9  ALBUMIN 4.1   No results for input(s): LIPASE, AMYLASE in the last 168 hours. No results for input(s): AMMONIA in the last 168 hours. Coagulation Profile: Recent Labs  Lab 02/09/19 1342  INR 0.9   Cardiac Enzymes: No results for input(s): CKTOTAL, CKMB, CKMBINDEX, TROPONINI in the last 168 hours. BNP (last 3 results) No results for input(s): PROBNP in the last 8760 hours. HbA1C: No results for input(s): HGBA1C in the last 72 hours. CBG: No results for input(s): GLUCAP in the last 168 hours. Lipid Profile: No results for input(s): CHOL, HDL, LDLCALC, TRIG, CHOLHDL, LDLDIRECT in the last 72 hours. Thyroid Function Tests: No results for input(s): TSH, T4TOTAL, FREET4, T3FREE, THYROIDAB in the last 72 hours. Anemia Panel: No results for input(s): VITAMINB12, FOLATE, FERRITIN, TIBC, IRON, RETICCTPCT in the last 72 hours. Urine analysis:    Component Value Date/Time   COLORURINE YELLOW 02/09/2019 [place] 02/09/2019 1428   LABSPEC 1.015 02/09/2019 1428   PHURINE 6.0 02/09/2019 1428   GLUCOSEU NEGATIVE 02/09/2019 1428   HGBUR NEGATIVE 02/09/2019 1428   BILIRUBINUR NEGATIVE 02/09/2019 1428   KETONESUR 15 (A) 02/09/2019 1428   PROTEINUR NEGATIVE 02/09/2019 1428   NITRITE NEGATIVE 02/09/2019 1428   LEUKOCYTESUR NEGATIVE 02/09/2019 1428     Radiological Exams on Admission: Ct Head Wo Contrast  Result Date: 02/09/2019 CLINICAL DATA:  Speech difficulty EXAM: CT HEAD WITHOUT CONTRAST TECHNIQUE: Contiguous axial images were obtained from the base of the skull through the vertex without intravenous contrast. COMPARISON:  None. FINDINGS: Brain: There is no acute intracranial hemorrhage, mass-effect, or edema. Gray-white differentiation is preserved. There is no extra-axial fluid collection. There are age-indeterminate small vessel infarcts of the right basal ganglia and adjacent white matter as well as the right subinsular white matter. Ventricles and sulci are within normal limits in size and configuration. Vascular: No hyperdense vessel or unexpected calcification. Skull: Calvarium is unremarkable. Sinuses/Orbits: Minor paranasal sinus mucosal thickening. Orbits are unremarkable. Other: Mastoid air cells are clear. IMPRESSION: No acute intracranial hemorrhage or mass effect. Age-indeterminate small vessel infarcts of the right basal ganglia and right subinsular white matter. Electronically Signed   By:  Macy Mis M.D.   On: 02/09/2019 14:26    EKG: Independently reviewed.  Sinus rhythm.  No prior tracing for comparison.  Assessment/Plan Principal Problem:   TIA (transient ischemic attack) Active Problems:   HTN (hypertension)   Lab test positive for detection of COVID-19 virus   Hypokalemia  TIA Patient had a 20-minute episode of aphasia yesterday 12/4 at around 12:30 PM.  Currently back to his baseline and no focal neuro deficit on exam.  Does have risk factors for stroke including longstanding untreated hypertension.  Head CT negative for acute finding.  He was assessed by telemetry neurology and admission recommended for TIA work-up. -Telemetry monitoring -Allow for permissive hypertension -MRI of the brain without contrast -MRA head and neck without contrast -2D echocardiogram -Hemoglobin A1c, fasting lipid panel -Aspirin  325 daily -Atorvastatin 80 mg daily -Frequent neurochecks -PT, OT, speech therapy. -N.p.o. until cleared by bedside swallow evaluation or formal speech evaluation -Discussed with neurology.  Stroke team will consult in a.m.  COVID-19 positive SARS-CoV-2 test positive.  Patient is asymptomatic.  Not endorsing any respiratory or other significant symptoms consistent with a viral illness.  Not hypoxic and no increased work of breathing.  Oxygen saturation 98-100% on room air. -Chest x-ray to check for findings consistent with COVID-19 viral pneumonia -Vitamin C and zinc -Continuous pulse ox. Supplemental oxygen if needed to keep oxygen saturation above 90% -Airborne and contact precautions -Check inflammatory markers  Hypertension Patient has a longstanding history of hypertension and has been off of medications for the past 8 years.  Recently found to be significantly hypertensive and started on losartan-hydrochlorothiazide by PCP 3 days ago.  Currently normotensive. -Continue home medication  Mild hypokalemia Potassium 3.4.  Likely related to diuretic use. -Replete potassium.  Check magnesium level and replete if low.  DVT prophylaxis: Lovenox Code Status: Full code Family Communication: No family available. Disposition Plan: Anticipate discharge after clinical improvement. Consults called: Neurology (Dr. Lorraine Lax) Admission status: It is my clinical opinion that referral for OBSERVATION is reasonable and necessary in this patient based on the above information provided. The aforementioned taken together are felt to place the patient at high risk for further clinical deterioration. However it is anticipated that the patient may be medically stable for discharge from the hospital within 24 to 48 hours.  The medical decision making on this patient was of high complexity and the patient is at high risk for clinical deterioration, therefore this is a level 3 visit.  Shela Leff MD  [facility] Pager [phone]  If 7PM-7AM, please contact night-coverage [email or web address] Password [email or web address]  02/10/2019, 4:52 AM

## 2019-02-10 NOTE — Progress Notes (Signed)
OT Cancellation Note  Patient Details Name: Dan Gibson MRN: [id number] DOB: [birth year]   Cancelled Treatment:    Reason Eval/Treat Not Completed: OT screened, no needs identified, will sign off  Malka So 02/10/2019, 10:22 AM  Nestor Lewandowsky, OTR/L Acute Rehabilitation Services Pager: [phone] Office: [phone]

## 2019-02-10 NOTE — Progress Notes (Signed)
Discharge instructions were discussed with pt. Stroke symptoms and monitoring BP was also discussed. We discussed hypertension and ways to help keep BP down such as not eating a lot of foods with salt. Pt took discharge paper work with him with all the information discussed. Pt know to follow up with neurology and knows where phone number is at in discharge paper work. We discussed COVID-19 and ways to keep his wife from getting the virus as well. Denied questions regarding discharge teaching.

## 2019-02-10 NOTE — Consult Note (Signed)
Referring Physician: Dr Sloan Leiter    Reason for Consult: stroke  HPI: Dan Gibson is an 63 y.o. male with a history of hypertension and known Covid who was seen by Anabel Halon, MD, tele neurologist, at 2:30 in the afternoon on 02/09/19 after the patient presented to the [place] in [facility] with a transient episode of aphasia that lasted approximately 50 minutes. It was initially felt that this represented a TIA ; however, a CT scan showed age-indeterminate small vessel infarcts of the right basal ganglia and right subinsular white matter. The pt's NIHSS was a zero when seen. He apparently had a similar episode about a year ago but no workup was performed. The patient was transferred to [facility] for admission. A chest x-ray showed mild multifocal pneumonia today. The pt had been off BP medication for eight years. He had a skin cancer removed recently and his BP was noted to be elevated. He saw his PCP and Losartan / HCT  was prescribed. BP on admission was 144/91.  He is feeling extremely well, resolved. Not compliant at home with medication, he was on ASA in the past but stopped it.  Date last known well: Date: 01/10/2019 Time last known well: Time: 12:30  tPA Given: no - deficits resolved spontaneously.  Past Medical History Past Medical History:  Diagnosis Date  . Hypertension   . Skin cancer     Surgical History Past Surgical History:  Procedure Laterality Date  . BACK SURGERY    . VASECTOMY      Family History  History reviewed. No pertinent family history.  Social History:   reports that he has quit smoking. He has never used smokeless tobacco. He reports current alcohol use. He reports that he does not use drugs.  Allergies:  No Known Allergies  Home Medications:  Medications Prior to Admission  Medication Sig Dispense Refill  . colchicine 0.6 MG tablet Take 0.6 mg by mouth 2 (two) times daily as needed. Gout    . fluorouracil (EFUDEX) 5 % cream Apply 1  application topically 2 (two) times daily. For 30 days. Apply and leave on for 6 hours then wash off.    . losartan-hydrochlorothiazide (HYZAAR) 50-12.5 MG tablet Take 1 tablet by mouth daily.      Hospital Medications . aspirin  325 mg Oral Daily  . atorvastatin  80 mg Oral q1800  . Chlorhexidine Gluconate Cloth  6 each Topical Daily  . enoxaparin (LOVENOX) injection  40 mg Subcutaneous Q24H  . hydrochlorothiazide  12.5 mg Oral Daily  . losartan  50 mg Oral Daily  . vitamin C  500 mg Oral Daily  . zinc sulfate  220 mg Oral Daily    ROS:  History obtained from Patient, negative ROS as below  General ROS: negative for - chills, fatigue, fever, night sweats, weight gain or weight loss Psychological ROS: negative for - behavioral disorder, hallucinations, memory difficulties, mood swings or suicidal ideation Ophthalmic ROS: negative for - blurry vision, double vision, eye pain or loss of vision ENT ROS: negative for - epistaxis, nasal discharge, oral lesions, sore throat, tinnitus or vertigo Allergy and Immunology ROS: negative for - hives or itchy/watery eyes Hematological and Lymphatic ROS: negative for - bleeding problems, bruising or swollen lymph nodes Endocrine ROS: negative for - galactorrhea, hair pattern changes, polydipsia/polyuria or temperature intolerance Respiratory ROS: negative for - cough, hemoptysis, shortness of breath or wheezing Cardiovascular ROS: negative for - chest pain, dyspnea on exertion, edema or irregular  heartbeat Gastrointestinal ROS: negative for - abdominal pain, diarrhea, hematemesis, nausea/vomiting or stool incontinence Genito-Urinary ROS: negative for - dysuria, hematuria, incontinence or urinary frequency/urgency Musculoskeletal ROS: negative for - joint swelling or muscular weakness Neurological ROS: as noted in HPI Dermatological ROS: negative for rash and skin lesion changes     Vitals:   02/10/19 1000 02/10/19 1224 02/10/19 1300 02/10/19  1400  BP: (!) 149/93  (!) 141/90 124/85  Pulse: 74 68    Resp: 18  14 18   Temp:      TempSrc:      SpO2: 98% 98% 98% 98%  Weight:      Height:         Neurologic Examination:   Exam: NAD, pleasant                  Speech:    Speech is normal; fluent and spontaneous with normal comprehension.  Cognition:    The patient is oriented to person, place, and time;     recent and remote memory intact;     language fluent;    Cranial Nerves:    The pupils are equal, round, and reactive to light.Trigeminal sensation is intact and the muscles of mastication are normal. The face is symmetric. The palate elevates in the midline. Hearing intact. Voice is normal. Shoulder shrug is normal. The tongue has normal motion without fasciculations.   Coordination:  No dysmetria  Motor Observation:    No asymmetry, no atrophy, and no involuntary movements noted. Tone:    Normal muscle tone.     Strength:    Strength is V/V in the upper and lower limbs.      Sensation: intact to LT   LABORATORY STUDIES:  Basic Metabolic Panel: Recent Labs  Lab 02/09/19 1342 02/10/19 0541  NA 136  --   K 3.4*  --   CL 103  --   CO2 22  --   GLUCOSE 110*  --   BUN 18  --   CREATININE 1.06  --   CALCIUM 9.2  --   MG  --  2.0    Liver Function Tests: Recent Labs  Lab 02/09/19 1342  AST 29  ALT 17  ALKPHOS 70  BILITOT 0.9  PROT 7.9  ALBUMIN 4.1   No results for input(s): LIPASE, AMYLASE in the last 168 hours. No results for input(s): AMMONIA in the last 168 hours.  CBC: Recent Labs  Lab 02/09/19 1342  WBC 4.7  NEUTROABS 3.2  HGB 16.9  HCT 48.6  MCV 90.8  PLT 161    Cardiac Enzymes: No results for input(s): CKTOTAL, CKMB, CKMBINDEX, TROPONINI in the last 168 hours.  BNP: Invalid input(s): POCBNP  CBG: No results for input(s): GLUCAP in the last 168 hours.  Microbiology:   Coagulation Studies: Recent Labs    02/09/19 1342  LABPROT 12.4  INR 0.9    Urinalysis:   Recent Labs  Lab 02/09/19 1428  COLORURINE YELLOW  LABSPEC 1.015  PHURINE 6.0  GLUCOSEU NEGATIVE  HGBUR NEGATIVE  BILIRUBINUR NEGATIVE  KETONESUR 15*  PROTEINUR NEGATIVE  NITRITE NEGATIVE  LEUKOCYTESUR NEGATIVE    Lipid Panel:     Component Value Date/Time   CHOL 149 02/10/2019 0541   TRIG 94 02/10/2019 0541   HDL 45 02/10/2019 0541   CHOLHDL 3.3 02/10/2019 0541   VLDL 19 02/10/2019 0541   LDLCALC 85 02/10/2019 0541    HgbA1C:  Lab Results  Component Value Date   HGBA1C  5.6 02/10/2019    Urine Drug Screen:      Component Value Date/Time   LABOPIA NONE DETECTED 02/09/2019 1428   COCAINSCRNUR NONE DETECTED 02/09/2019 1428   LABBENZ NONE DETECTED 02/09/2019 1428   AMPHETMU NONE DETECTED 02/09/2019 1428   THCU NONE DETECTED 02/09/2019 1428   LABBARB NONE DETECTED 02/09/2019 1428     Alcohol Level:  Recent Labs  Lab 02/09/19 1342  ETH <10    Miscellaneous labs:  EKG  EKG - SR rate 90 BPM. (See cardiology reading for complete details)   IMAGING:  Ct Head Wo Contrast 02/09/2019 IMPRESSION:  No acute intracranial hemorrhage or mass effect. Age-indeterminate small vessel infarcts of the right basal ganglia and right subinsular white matter.   Dg Chest Port 1 View 02/10/2019 IMPRESSION:  Mild multifocal pneumonia in this patient with known COVID.   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 Is there a history of previous CNS hemorrhagic event? Consider MRI of the thoracic spine with and without contrast as thoracic dural AV fistula can be a cause of this, particularly in males of this age.  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 Chronic small-vessel ischemic changes elsewhere of the cerebral hemispheric white matter.  Negative noncontrast MR angiography of the neck vessels and intracranial vessels.   Transthoracic Echocardiogram -    Left Ventricle: Left ventricular  ejection fraction, by visual estimation, is 60 to 65%. The left ventricle has normal function. There is no left ventricular hypertrophy. Left ventricular diastolic parameters are consistent with Grade I diastolic  dysfunction (impaired relaxation). Normal left atrial pressure. There is a false tendon in the left ventricular apex.   Assessment:  Mr. Dan Gibson is a 63 y.o. male with history of hypertension with possible medical non compliance, previous TIA, gout, recent skin cancer removal and Covid infection with pneumonia on CXR presenting with transient speech difficulties.  He did not receive IV t-PA due to resolution of deficits.   Stroke:  Age-indeterminate small vessel infarcts of the right basal ganglia and right subinsular white matter possibly due to hypercoagulability related to Covid infection.  Resultant  resolved  Code Stroke CT Head - not ordered  CT head - Age-indeterminate small vessel infarcts of the right basal ganglia and right subinsular white matter.   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 Is there a history of previous CNS hemorrhagic event? 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 Chronic small-vessel ischemic changes elsewhere of the cerebral hemispheric white matter.  MRA Head Negative noncontrast MR angiography of the neck vessels and intracranial vessels.  CTA H&N - not ordered  CT Perfusion - not ordered  Carotid Doppler - MRA neck pending - carotid dopplers not indicated.  2D Echo - No thrombus, neg for PFO  [employer] Virus 2 - positive  LDL - 85  HgbA1c - 5.6  UDS - negative  VTE prophylaxis - Lovenox Diet  Diet Order            Diet Heart Room service appropriate? Yes; Fluid consistency: Thin  Diet effective now              No antithrombotic prior to admission, now on aspirin 325 mg daily  Patient counseled to be compliant with his antithrombotic  medications. Discharge on ASA 81mg  for stroke prevention. 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  Ongoing aggressive stroke  risk factor management  Therapy recommendations:  pending  Disposition:  Pending  Hypertension  Home BP meds: Losartan / HCT  Current BP meds: Losartan and HCTZ  Stable . Permissive hypertension (OK if < 220/120) but gradually normalize in 5-7 days  . Long-term BP goal normotensive  Hyperlipidemia  Home Lipid lowering medication: none   LDL 85, goal < 70  Current lipid lowering medication: Lipitor 80 mg daily   Continue statin at discharge  Other Stroke Risk Factors  Advanced age  Former cigarette smoker - quit  ETOH use, advised to drink no more than 1 alcoholic beverage per day.  Hx stroke/TIA   Other Active Problems  Covid Infection - temp 99.1  Pneumonia by CXR  Hypokalemia - 3.4 (likely due to diuretic) - supplemented  Fibrinogen - 538 (H)   CRP - 3.44 (H)   D-dimer - 0.51 (H)   Ferritin - 513 (H)   Plan:  Risk factor modification  Telemetry monitoring  Frequent neuro checks  Fall Precautions  ASA 81mg  for stroke prevention. Will not place on DUAP due to extensive siderosis, chronic to be evaluated outpatient.Consider MRI of the thoracic spine outpatient with and without contrast as thoracic dural AV fistula can be a cause of this, particularly in males of this age.  Continue statin  Dr. Leonie Man outpatient, [facility], please place consult for Dr. Leonie Man  I had a long d/w patient about TIA, risk for recurrent stroke/TIAs, personally independently reviewed imaging studies and stroke evaluation results and answered questions. ASA 81mg  for secondary stroke prevention and maintain strict control of hypertension with blood pressure goal below 130/90, diabetes with hemoglobin A1c goal below 6.5% and lipids with LDL cholesterol goal below 70 mg/dL.I  also advised the patient to eat a healthy  diet with plenty of whole grains, cereals, fruits and vegetables, exercise regularly and maintain ideal body weight .  Followup in the future with DR. SETHI in neurology in 30-60 days. Needs further eval of Superficial Siderosis and MRI Thoracic spine   Personally examined patient and images, and have participated in and made any corrections needed to history, physical, neuro exam,assessment and plan as stated above.  I have personally obtained the history, evaluated lab date, reviewed imaging studies and agree with radiology interpretations.    Sarina Ill, MD Stroke Neurology   A total of 35 minutes was spent for the care of this patient, spent on counseling patient and family on different diagnostic and therapeutic options, counseling and coordination of care, riskd ans benefits of management, compliance, or risk factor reduction and education.

## 2019-02-10 NOTE — Progress Notes (Signed)
Was transported in bed to room 78M 15 accompanied by 3 staff.  Belongings accompanied him.  When asked if he had been exposed to [place] or anyone positive in the past 30 days he said "no" but when he was being transported to his new room, he admitted that his son-in-law had COVID first and probably gave it to him.

## 2019-02-10 NOTE — Progress Notes (Signed)
  Echocardiogram 2D Echocardiogram has been performed.  Dan Gibson 02/10/2019, 1:37 PM

## 2019-02-10 NOTE — Progress Notes (Signed)
Received a call from the lab that his COVID results came back that he is positive.  Notified MD and pt and immediately placed pt in isolation. Discussed this with him and he said he is not worried about COVID.  I did have to remind him that he needs to wear his mask, as he just had it around his ears when I entered the room and reminded him that he needs to wear it when staff enters the room.

## 2019-02-10 NOTE — Progress Notes (Signed)
Received from [facility] by [facility].  Oriented to room and surroundings.  Verbal stroke educated started.  Pt was annoyed that he has been in the ED for so long and has not been able to sleep.  Said he just wants to sleep.  This nurse reminded him that she needed to assess him and reassess him but would be quick when she came in to assess him so he could get some sleep.

## 2019-02-10 NOTE — Progress Notes (Signed)
SLP Cancellation Note  Patient Details Name: NORVIN RABAS MRN: [id number] DOB: Nov 24, 1955   Cancelled treatment:       Reason Eval/Treat Not Completed: Other (comment). Pt passed [facility], neuro deficits have resolved per chart, CT negative MRI pending. Will defer SLP eval and treat until MRI results are available, if negative will sign off.    Priscillia Fouch, Katherene Ponto 02/10/2019, 7:52 AM

## 2019-02-10 NOTE — Evaluation (Signed)
Physical Therapy Evaluation Patient Details Name: Dan Gibson MRN: [id number] DOB: 1955/11/20 Today's Date: 02/10/2019   History of Present Illness  63 y.o. male with medical history significant of hypertension, skin cancer presenting to the ED for evaluation of high blood pressure and a speech problem.  Admitted for TIA. Covid positive.    Clinical Impression  PT eval complete. Pt is independent with functional mobility. Balance and strength intact. Pt reports all symptoms have resolved and he is back to baseline. No further skilled PT intervention indicated. PT signing off.    Follow Up Recommendations No PT follow up    Equipment Recommendations  None recommended by PT    Recommendations for Other Services       Precautions / Restrictions Precautions Precautions: None      Mobility  Bed Mobility Overal bed mobility: Independent                Transfers Overall transfer level: Independent                  Ambulation/Gait Ambulation/Gait assistance: Independent Gait Distance (Feet): 150 Feet Assistive device: None Gait Pattern/deviations: WFL(Within Functional Limits) Gait velocity: WFL   General Gait Details: steady gait  Stairs            Wheelchair Mobility    Modified Rankin (Stroke Patients Only)       Balance Overall balance assessment: Independent                                           Pertinent Vitals/Pain Pain Assessment: No/denies pain    Home Living Family/patient expects to be discharged to:: Private residence Living Arrangements: Spouse/significant other Available Help at Discharge: Family Type of Home: House Home Access: Level entry     Home Layout: One level Home Equipment: None      Prior Function Level of Independence: Independent               Hand Dominance        Extremity/Trunk Assessment   Upper Extremity Assessment Upper Extremity Assessment: Overall WFL for tasks  assessed    Lower Extremity Assessment Lower Extremity Assessment: Overall WFL for tasks assessed    Cervical / Trunk Assessment Cervical / Trunk Assessment: Normal  Communication   Communication: No difficulties  Cognition Arousal/Alertness: Awake/alert Behavior During Therapy: WFL for tasks assessed/performed Overall Cognitive Status: Within Functional Limits for tasks assessed                                        General Comments General comments (skin integrity, edema, etc.): VSS, ambulated on RA with SpO2 97%    Exercises     Assessment/Plan    PT Assessment Patent does not need any further PT services  PT Problem List         PT Treatment Interventions      PT Goals (Current goals can be found in the Care Plan section)  Acute Rehab PT Goals Patient Stated Goal: home PT Goal Formulation: All assessment and education complete, DC therapy    Frequency     Barriers to discharge        Co-evaluation               AM-PAC PT "6 Clicks"  Mobility  Outcome Measure Help needed turning from your back to your side while in a flat bed without using bedrails?: None Help needed moving from lying on your back to sitting on the side of a flat bed without using bedrails?: None Help needed moving to and from a bed to a chair (including a wheelchair)?: None Help needed standing up from a chair using your arms (e.g., wheelchair or bedside chair)?: None Help needed to walk in hospital room?: None Help needed climbing 3-5 steps with a railing? : None 6 Click Score: 24    End of Session   Activity Tolerance: Patient tolerated treatment well Patient left: in bed;with nursing/sitter in room;with call bell/phone within reach Nurse Communication: Mobility status PT Visit Diagnosis: Other abnormalities of gait and mobility (R26.89)    Time: 0940-1010 PT Time Calculation (min) (ACUTE ONLY): 30 min   Charges:   PT Evaluation $PT Eval Low Complexity: 1  Low PT Treatments $Gait Training: 8-22 mins        Lorrin Goodell, PT  Office # [phone] Pager [phone]   Lorriane Shire 02/10/2019, 10:19 AM

## 2019-04-05 ENCOUNTER — Telehealth: Payer: Self-pay

## 2019-04-05 ENCOUNTER — Inpatient Hospital Stay: Payer: Managed Care, Other (non HMO) | Admitting: Neurology

## 2019-04-05 NOTE — Telephone Encounter (Signed)
Patient no show for appt today. 

## 2019-04-09 ENCOUNTER — Encounter: Payer: Self-pay | Admitting: Neurology

## 2021-05-06 IMAGING — MR MR MRA HEAD W/O CM
11 of 17 series · 27 of 48 positions shown · non-contrast
Comparison: Head CT yesterday.

CLINICAL DATA: Hypertension and speech disturbance.



[Series 2: DWI · axial · 3.0mm · 0.94mm/px · z∈[-113,+39]mm · 4 of 104 slices shown (1 of 2)]
[im 1/104]
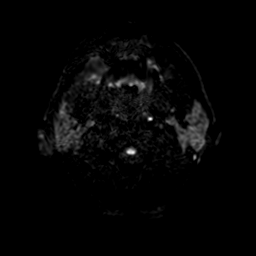
[im 35/104]
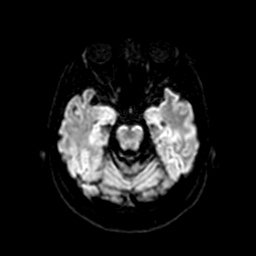
[im 69/104]
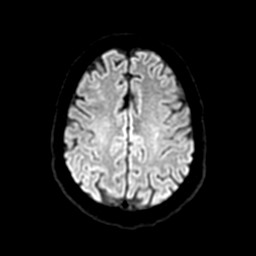
[im 104/104]
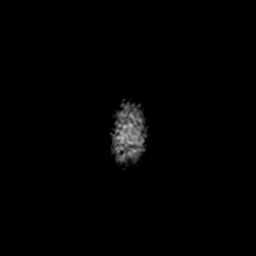

[Series 5: DWI · coronal · 4.0mm · 0.94mm/px · 3 of 72 slices shown (2 of 2)]
[im 1/72]
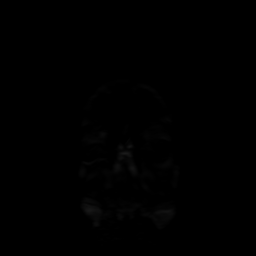
[im 36/72]
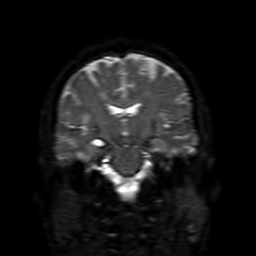
[im 72/72]
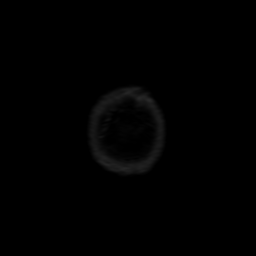

[Series 7: FLAIR · axial · 3.0mm · 0.45mm/px · 1 of 26 slices shown (1 of 2)]
[im 1/26]
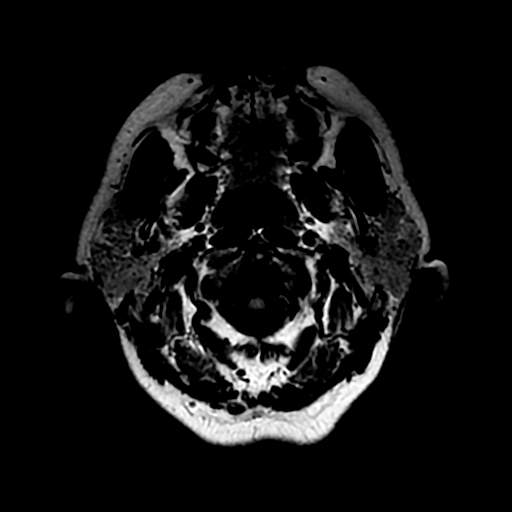

[Series 8: SWI · axial · 3.0mm · 0.45mm/px · z∈[-111,+42]mm · 5 of 104 slices shown (1 of 2)]
[im 1/104]
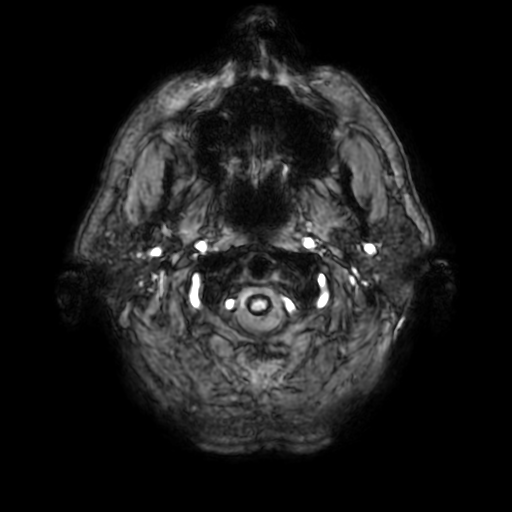
[im 26/104]
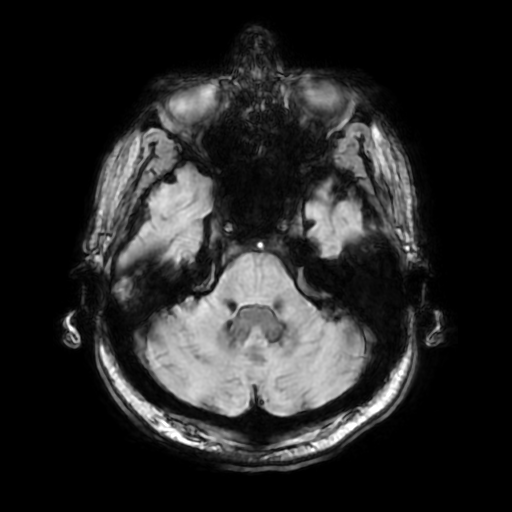
[im 52/104]
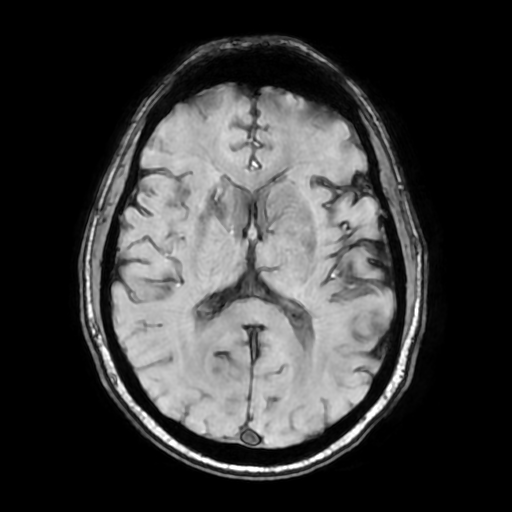
[im 78/104]
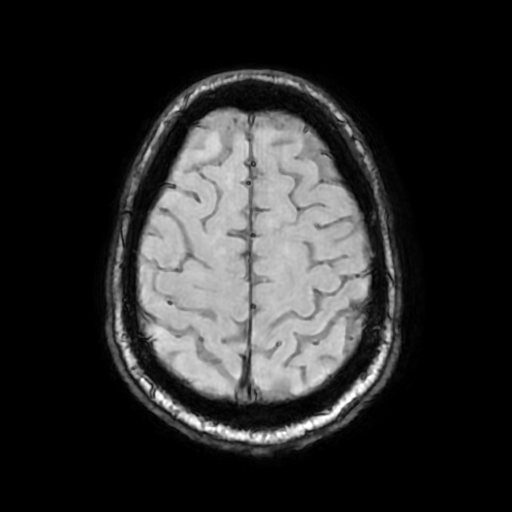
[im 104/104]
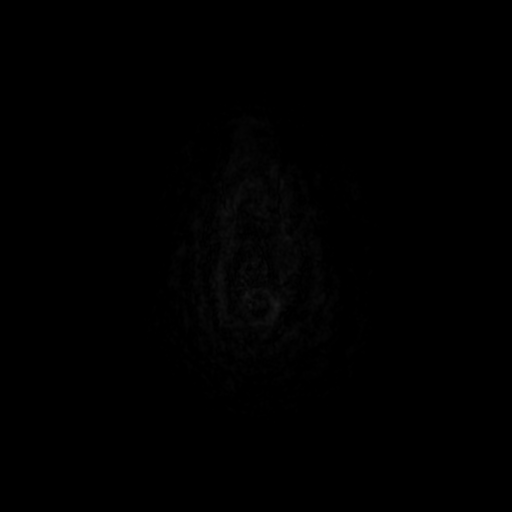

[Series 9: FLAIR · sagittal · 5.0mm · 0.47mm/px · 1 of 25 slices shown (2 of 2)]
[im 1/25]
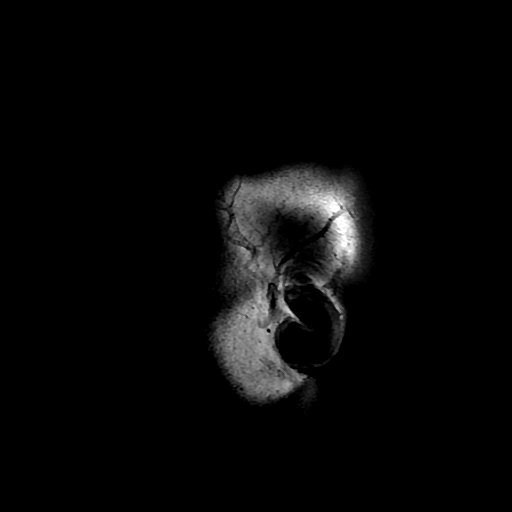

[Series 10: T2 · axial · 5.0mm · 0.45mm/px · 1 of 26 slices shown (1 of 2)]
[im 1/26]
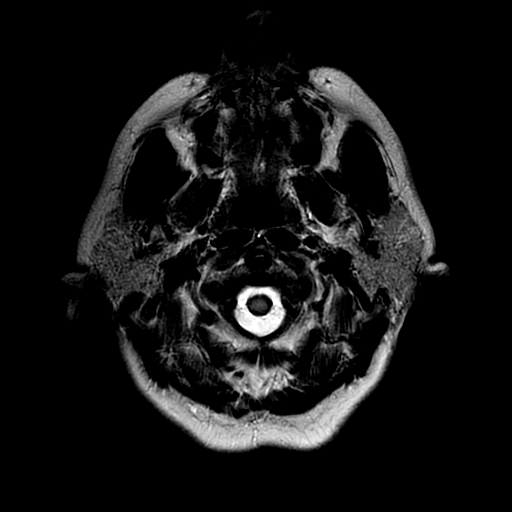

[Series 11: T1 · axial · non-contrast · 3.0mm · 0.90mm/px · z∈[-111,+41]mm · 2 of 52 slices shown]
[im 1/52]
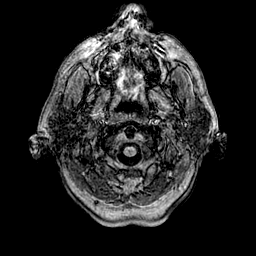
[im 52/52]
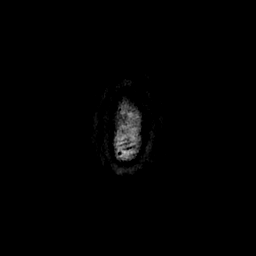

[Series 12: T2 · coronal · 5.0mm · 0.39mm/px · 1 of 30 slices shown (2 of 2)]
[im 1/30]
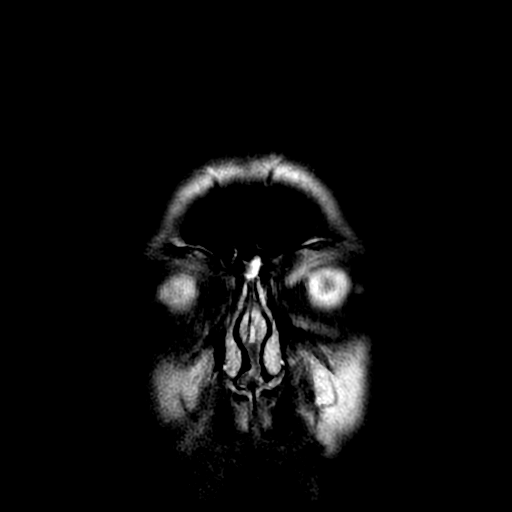

[Series 250: ADC · axial · 3.0mm · 0.94mm/px · z∈[-113,+39]mm · 2 of 51 slices shown (1 of 2)]
[im 1/51]
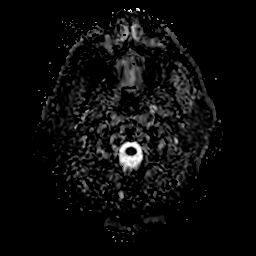
[im 51/51]
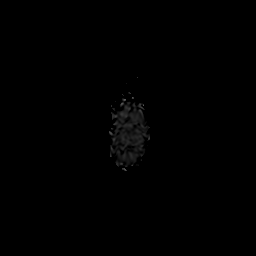

[Series 550: ADC · coronal · 4.0mm · 0.94mm/px · 2 of 36 slices shown (2 of 2)]
[im 1/36]
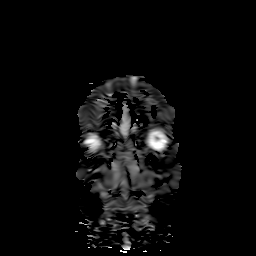
[im 36/36]
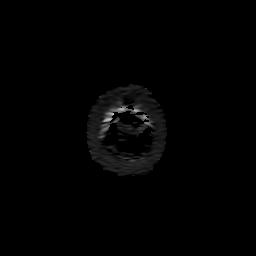

[Series 800: SWI · axial · 3.0mm · 0.45mm/px · z∈[-111,+42]mm · 5 of 101 slices shown (2 of 2)]
[im 1/101]
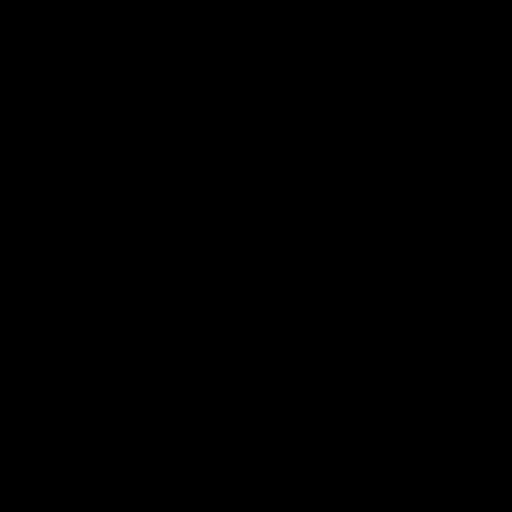
[im 26/101]
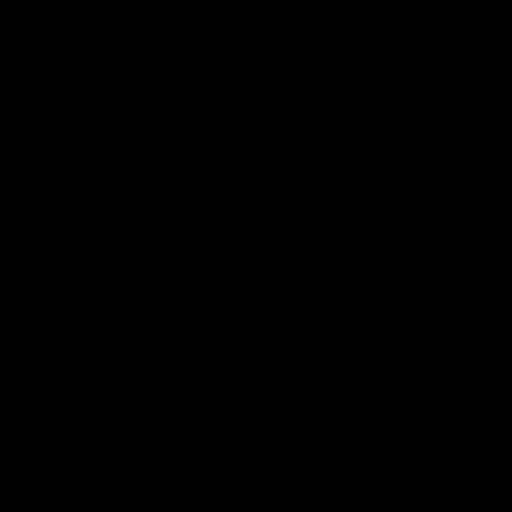
[im 51/101]
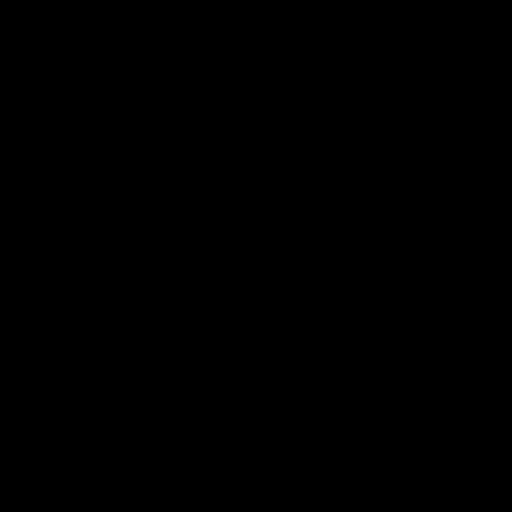
[im 76/101]
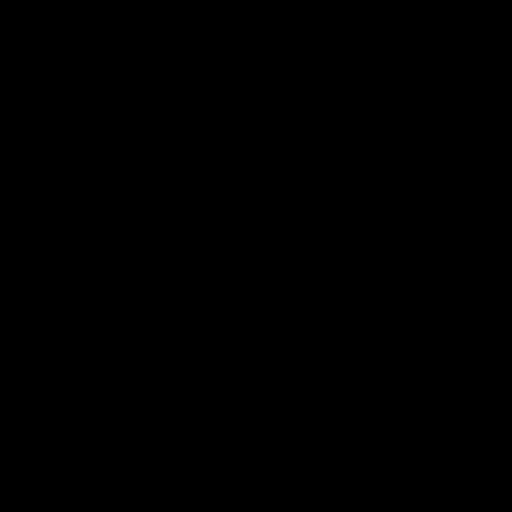
[im 101/101]
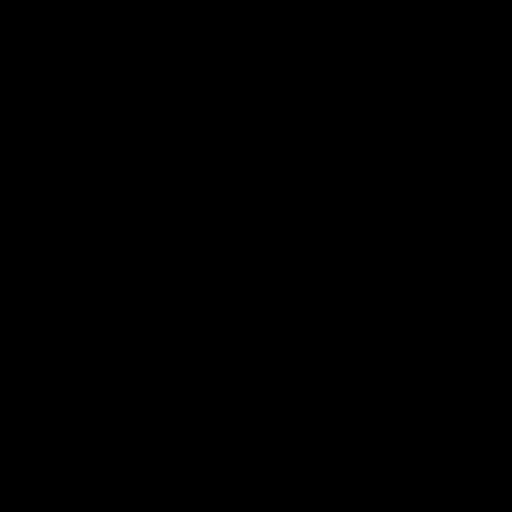

[27 of 48 positions shown; findings below may reference images not displayed]

FINDINGS: MRI HEAD FINDINGS

Brain: Diffusion imaging does not show any acute or subacute
infarction. The brainstem and cerebellum do not show any focal
lesion. There is superficial siderosis, etiology unknown. Cerebral
hemispheres show old small vessel infarctions in the basal ganglia
on the right and moderate chronic small-vessel ischemic changes of
the deep and subcortical white matter. No large vessel territory
infarction. Superficial siderosis also affecting the surface of the
supratentorial brain, though much less pronounced. No hydrocephalus.
No extra-axial collection.

Vascular: Major vessels at the base of the brain show flow.

Skull and upper cervical spine: Negative

Sinuses/Orbits: Clear/normal

Other: None

MRA HEAD FINDINGS

Both internal carotid arteries are widely patent into the brain. No
siphon stenosis. The anterior and middle cerebral vessels are patent
without proximal stenosis, aneurysm or vascular malformation.

Both vertebral arteries are widely patent to the basilar. No basilar
stenosis. Posterior circulation branch vessels appear normal.

MRA NECK FINDINGS

Aortic arch appears normal. Branching pattern is normal without
origin stenosis. Both common carotid arteries are widely patent to
the bifurcation region. Both carotid bifurcations appear normal.
Cervical internal carotid arteries appear normal.

Both vertebral arteries appear widely patent at their origin and
through the cervical region to the basilar.
IMPRESSION: Extensive changes of superficial siderosis of the brain without
visible etiology. Is there a history of previous CNS hemorrhagic
event? Consider MRI of the thoracic spine with and without contrast
as thoracic dural AV fistula can be a cause of this, particularly in
males of this age.

Old right basal ganglia infarctions with some hemosiderin
deposition. Chronic small-vessel ischemic changes elsewhere of the
cerebral hemispheric white matter.

Negative noncontrast MR angiography of the neck vessels and
intracranial vessels.
